# Patient Record
Sex: Female | Born: 1985 | Race: White | Hispanic: No | Marital: Married | Smoking: Never smoker
Health system: Southern US, Community
[De-identification: ages and names within clinical notes are randomized; demographics above are authoritative.]

## PROBLEM LIST (undated history)

## (undated) DIAGNOSIS — J9811 Atelectasis: Secondary | ICD-10-CM

## (undated) DIAGNOSIS — N809 Endometriosis, unspecified: Secondary | ICD-10-CM

## (undated) HISTORY — PX: LUNG SURGERY: SHX703

## (undated) HISTORY — PX: CHOLECYSTECTOMY: SHX55

---
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
## 2007-06-26 ENCOUNTER — Other Ambulatory Visit: Admission: RE | Admit: 2007-06-26 | Discharge: 2007-06-26 | Payer: Self-pay | Admitting: Family Medicine

## 2007-11-27 ENCOUNTER — Encounter: Admission: RE | Admit: 2007-11-27 | Discharge: 2007-11-27 | Payer: Self-pay | Admitting: Family Medicine

## 2007-12-07 ENCOUNTER — Ambulatory Visit (HOSPITAL_COMMUNITY): Admission: RE | Admit: 2007-12-07 | Discharge: 2007-12-07 | Payer: Self-pay | Admitting: Family Medicine

## 2008-01-17 ENCOUNTER — Encounter (INDEPENDENT_AMBULATORY_CARE_PROVIDER_SITE_OTHER): Payer: Self-pay | Admitting: General Surgery

## 2008-01-17 ENCOUNTER — Ambulatory Visit (HOSPITAL_COMMUNITY): Admission: RE | Admit: 2008-01-17 | Discharge: 2008-01-17 | Payer: Self-pay | Admitting: General Surgery

## 2008-03-12 ENCOUNTER — Encounter: Admission: RE | Admit: 2008-03-12 | Discharge: 2008-03-12 | Payer: Self-pay | Admitting: Gastroenterology

## 2009-05-19 IMAGING — NM NM HEPATO W/GB/PHARM/[PERSON_NAME]
3 series · 13 of 13 positions shown · non-contrast
Comparison: None

CLINICAL DATA: Epigastric abdominal pain.  Nausea vomiting.
Symptoms have been present for 2 months.

NUCLEAR MEDICINE HEPATOBILIARY IMAGING WITH GALLBLADDER EF
TECHNIQUE: Sequential images of the abdomen were obtained [DATE]
minutes following intravenous administration of
radiopharmaceutical.   gallbladder ejection fraction was
determined.
Radiopharmaceutical:  5mCi 7c-WWm Choletec

[Series 1: he hepatobiliary · 1 of 1 slices shown (1 of 3)]
[im 1/1]
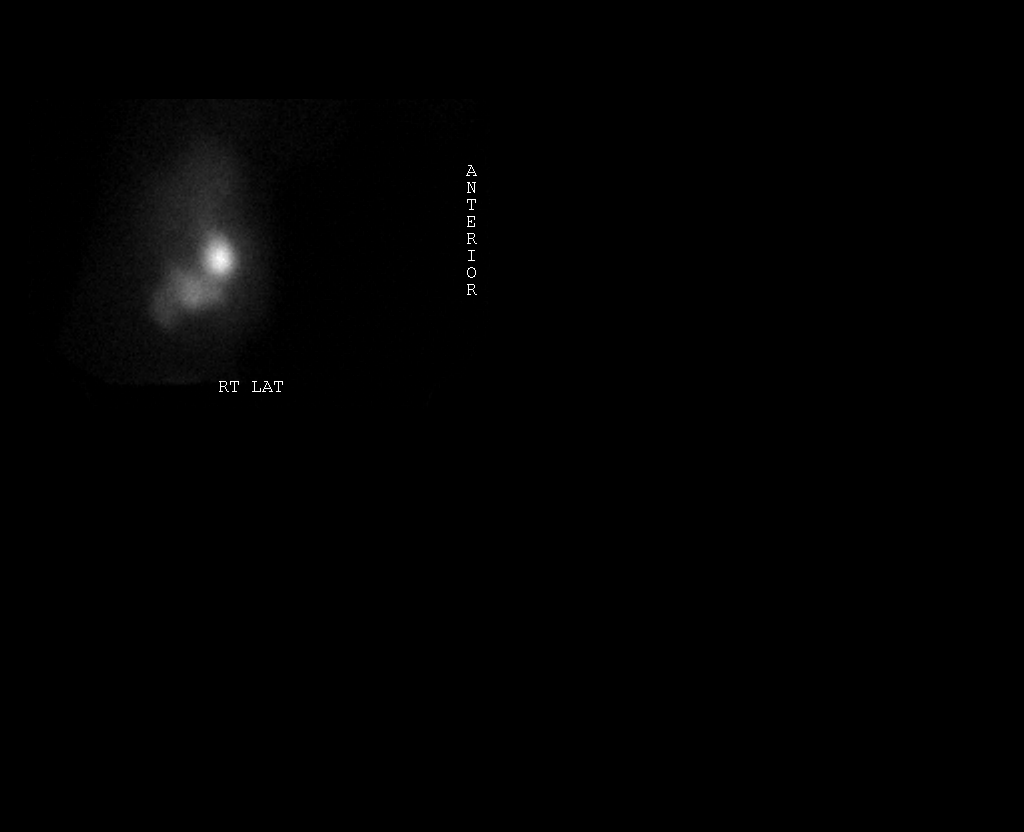

[Series 1: he hepatobiliary · 3.22mm/px · 6 of 60 frames shown (2 of 3)]
[frame 6/60]
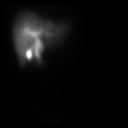
[frame 16/60]
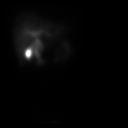
[frame 26/60]
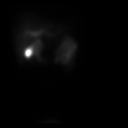
[frame 36/60]
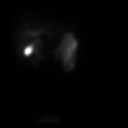
[frame 46/60]
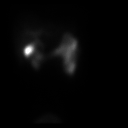
[frame 56/60]
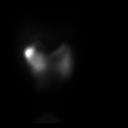

[Series 1: he hepatobiliary · 3.22mm/px · 6 of 60 frames shown (3 of 3)]
[frame 6/60]
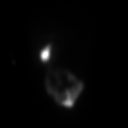
[frame 16/60]
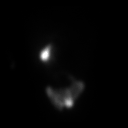
[frame 26/60]
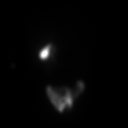
[frame 36/60]
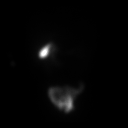
[frame 46/60]
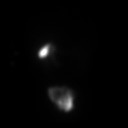
[frame 56/60]
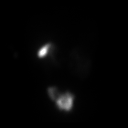

[13 of 13 positions shown; findings below may reference images not displayed]

FINDINGS: There was immediate uptake of the agent by the liver with
prompt excretion in the biliary tree with prompt visualization of
the gallbladder.  The gallbladder is visible 5 minutes  and there
is activity in the bowel at 10 minutes.

The patient was given 8 ounces of half and half  P O.  Ejection
fraction at 60 minutes was 18.2%.
IMPRESSION: Abnormal ejection fraction of only 18.2%  at 60 minutes.  However,
there was prompt visualization of the biliary tree and gallbladder.

## 2010-08-31 NOTE — Op Note (Signed)
NAMEIONE, SANDUSKY NO.:  [id number]   MEDICAL RECORD NO.:  [id number]          PATIENT TYPE:  AMB   LOCATION:  DAY                          FACILITY:  [facility]   PHYSICIAN:  Adolph Pollack, M.D.DATE OF BIRTH:  31-Oct-1985   DATE OF PROCEDURE:  01/17/2008  DATE OF DISCHARGE:                               OPERATIVE REPORT   PREOPERATIVE DIAGNOSIS:  Biliary dyskinesia.   POSTOPERATIVE DIAGNOSIS:  Biliary dyskinesia.   PROCEDURE:  Laparoscopic cholecystectomy with intraoperative  cholangiogram.   SURGEON:  Adolph Pollack, M.D.   ASSISTANT:  Wilmon Arms. Tsuei, M.D.   ANESTHESIA:  General.   INDICATIONS:  This 25 year old female has been having progressively  increasing problems with postprandial epigastric pain with nausea.  This  tends to be, at first it was worse with greasy or spicy meals but now  most things she eats if it is not very bland she has trouble.  She has  tried proton pump inhibitors, undergone endoscopy and there has been no  revealing pathology.  Abdominal ultrasound was normal but nuclear  medicine hepatic biliary scan demonstrates a depressed gallbladder  ejection fraction consistent with biliary dyskinesia.  She now presents  for elective laparoscopic cholecystectomy.   TECHNIQUE:  She was seen in the holding area and then brought to the  operating room and placed supine on the operating table and general  anesthetic was administered.  Her abdominal wall was sterilely prepped  and draped.  Marcaine was infiltrated in the subumbilical region.  A  subumbilical incision was made through the skin, subcutaneous tissue,  fascia and peritoneum entering the peritoneal cavity under direct  vision.  A pursestring suture of zero Vicryl was placed around the  fascial edges.  A Hasson trocar was introduced into the peritoneal  cavity and a pneumoperitoneum created by insufflation of CO2 gas.   Next a laparoscope was introduced.  Upon inspection  the liver appeared  to be normal.  Stomach appeared to be normal.   She was placed in a reverse Trendelenburg position with the right side  tilted slightly up.  An 11 mm trocar was placed through an epigastric  incision and two 5 mm trocars were placed in right upper quadrant  incisions after infiltration of Marcaine.  The fundus of the gallbladder  was grasped and there were adhesions between the gallbladder and omentum  which were taken down using blunt dissection and select electrocautery  close to the gallbladder.  The fundus of the gallbladder was then  retracted toward the right shoulder and the infundibulum retracted  laterally.  Using blunt dissection I was able to mobilize the  infundibulum.  I then identified the cystic duct and created a window  around it.  I also identified the anterior branch of the cystic artery,  created a window around it and clipped it.  There was still some  bleeding around this area at the anterior branch of the cystic artery  although it was minimal.  I decided to proceed with the cholangiogram.   I made a small incision in the cystic duct close to the  gallbladder and  passed a cholangiocatheter through the anterior abdominal wall and put  it into the cystic duct.  A clip was placed above the cystic duct  gallbladder junction.  Under real time fluoroscopy contrast was then  injected into the cystic duct which was of moderate length.  The common  hepatic, right and left hepatic and common bile ducts all filled and  contrast drained promptly into the duodenum without obvious evidence of  obstruction.  Final report is pending the radiologist's interpretation.   Following this I removed the cholangiocatheter, the cystic duct was  clipped three times proximally and divided.  I then divided the anterior  branch of the cystic artery which had been clipped.  I identified some  bleeding from the posterior branch which I was able to put clips across  and  control well.   Following this I dissected the gallbladder free from the liver using  electrocautery and placed it in an Endopouch bag.  The gallbladder fossa  was irrigated and inspected and hemostasis was adequate.  There was no  bile leak.  The gallbladder was then removed through the subumbilical  port and the subumbilical fascial defect was closed by tightening up and  tying down the pursestring suture.  The irrigation fluid was removed as  much as possible.  The remaining trocars were removed and the CO2 gas  released.  The skin incisions were closed with 4-0 Monocryl subcuticular  stitches followed by Steri-Strips and sterile dressings.  She tolerated  the procedure without any apparent complications and was taken to  recovery in satisfactory condition.      Adolph Pollack, M.D.  Electronically Signed     TJR/MEDQ  D:  01/17/2008  T:  01/18/2008  Job:  [id number]   cc:   Carilyn Goodpasture, PA

## 2011-01-17 LAB — CBC
HCT: 40.4
Hemoglobin: 13.8
MCHC: 34.1
MCV: 90.8
Platelets: 264
RBC: 4.45
RDW: 13.2
WBC: 10.7 — ABNORMAL HIGH

## 2011-01-17 LAB — BASIC METABOLIC PANEL
BUN: 6
CO2: 29
Calcium: 9.3
Chloride: 109
Creatinine, Ser: 0.59
GFR calc Af Amer: >60
GFR calc non Af Amer: >60
Glucose, Bld: 87
Potassium: 4.1
Sodium: 143

## 2011-01-17 LAB — DIFFERENTIAL
Basophils Absolute: 0.1
Basophils Relative: 1
Eosinophils Absolute: 0.3
Eosinophils Relative: 2
Lymphocytes Relative: 27
Lymphs Abs: 2.9
Monocytes Absolute: 0.8
Monocytes Relative: 7
Neutro Abs: 6.7
Neutrophils Relative %: 63

## 2011-01-17 LAB — PREGNANCY, URINE: Preg Test, Ur: NEGATIVE

## 2016-04-08 ENCOUNTER — Other Ambulatory Visit (HOSPITAL_COMMUNITY): Payer: Self-pay | Admitting: Obstetrics and Gynecology

## 2016-04-08 DIAGNOSIS — N979 Female infertility, unspecified: Secondary | ICD-10-CM

## 2016-04-15 ENCOUNTER — Encounter (HOSPITAL_COMMUNITY): Payer: Self-pay | Admitting: Radiology

## 2016-04-15 ENCOUNTER — Ambulatory Visit (HOSPITAL_COMMUNITY)
Admission: RE | Admit: 2016-04-15 | Discharge: 2016-04-15 | Disposition: A | Discharge status: Home / Self Care | Payer: BLUE CROSS/BLUE SHIELD | Source: Ambulatory Visit | Attending: Obstetrics and Gynecology | Admitting: Obstetrics and Gynecology

## 2016-04-15 ENCOUNTER — Other Ambulatory Visit (HOSPITAL_COMMUNITY): Payer: Self-pay | Admitting: Obstetrics and Gynecology

## 2016-04-15 DIAGNOSIS — N979 Female infertility, unspecified: Secondary | ICD-10-CM

## 2016-04-15 MED ORDER — IOPAMIDOL (ISOVUE-300) INJECTION 61%
30.0000 mL | Freq: Once | INTRAVENOUS | Status: AC | PRN
  Administered 2016-04-15: 4 mL

## 2016-04-15 MED ORDER — IOPAMIDOL (ISOVUE-300) INJECTION 61%: 4.0000 mL | Freq: Once | INTRAVENOUS | Status: DC | PRN

## 2022-02-10 ENCOUNTER — Ambulatory Visit
Admission: RE | Admit: 2022-02-10 | Discharge: 2022-02-10 | Disposition: A | Discharge status: Home / Self Care | Payer: BLUE CROSS/BLUE SHIELD | Source: Ambulatory Visit | Attending: Family Medicine | Admitting: Family Medicine

## 2022-02-10 VITALS — BP 131/87 | HR 119 | Temp 98.8°F | Resp 18

## 2022-02-10 DIAGNOSIS — Z113 Encounter for screening for infections with a predominantly sexual mode of transmission: Secondary | ICD-10-CM | POA: Diagnosis present

## 2022-02-10 HISTORY — DX: Endometriosis, unspecified: N80.9

## 2022-02-10 HISTORY — DX: Atelectasis: J98.11

## 2022-02-10 LAB — POCT URINE PREGNANCY: Preg Test, Ur: NEGATIVE

## 2022-02-10 NOTE — ED Triage Notes (Addendum)
Pt here today for STD screening. Found out husband was unfaithful and wants to be checked. Also states were trying for a pregnancy and would to have pregnancy test. LMP 01/15/22 Is currently breastfeeding.

## 2022-02-10 NOTE — ED Provider Notes (Signed)
[facility]    CSN: [id number] Arrival date & time: 02/10/22  1308      History   Chief Complaint Chief Complaint  Patient presents with   Screening for std    And preg test    HPI Brandi Pacheco is a 36 y.o. female.   HPI 36 year old female presents for possible STD.  Reports husband was unfaithful and request STD check as well as urine pregnancy test.  Reports last menstrual period of 01/15/2022 and is currently breast-feeding.  Past Medical History:  Diagnosis Date   Collapse of left lung    2013   Endometriosis     There are no problems to display for this patient.   Past Surgical History:  Procedure Laterality Date   CHOLECYSTECTOMY     2019   LUNG SURGERY     2013    OB History   No obstetric history on file.      Home Medications    Prior to Admission medications   Not on File    Family History History reviewed. No pertinent family history.  Social History Social History   Tobacco Use   Smoking status: Never   Smokeless tobacco: Never  Vaping Use   Vaping Use: Never used  Substance Use Topics   Alcohol use: Not Currently     Allergies   Fluconazole   Review of Systems Review of Systems  All other systems reviewed and are negative.    Physical Exam Triage Vital Signs ED Triage Vitals [02/10/22 1322]  Enc Vitals Group     BP 131/87     Pulse Rate (!) 119     Resp 18     Temp 98.8 F (37.1 C)     Temp Source Oral     SpO2 93 %     Weight      Height      Head Circumference      Peak Flow      Pain Score 0     Pain Loc      Pain Edu?      Excl. in GC?    No data found.  Updated Vital Signs BP 131/87 (BP Location: Right Arm)   Pulse (!) 119   Temp 98.8 F (37.1 C) (Oral)   Resp 18   LMP 01/15/2022 (Exact Date) Comment: is breastfeeding  SpO2 93%    Physical Exam Vitals and nursing note reviewed.  Constitutional:      General: She is not in acute distress.    Appearance: Normal appearance.  She is obese. She is not ill-appearing.  HENT:     Head: Normocephalic and atraumatic.     Mouth/Throat:     Mouth: Mucous membranes are moist.     Pharynx: Oropharynx is clear.  Eyes:     Extraocular Movements: Extraocular movements intact.     Conjunctiva/sclera: Conjunctivae normal.     Pupils: Pupils are equal, round, and reactive to light.  Cardiovascular:     Rate and Rhythm: Normal rate and regular rhythm.     Pulses: Normal pulses.     Heart sounds: Normal heart sounds.  Pulmonary:     Effort: Pulmonary effort is normal.     Breath sounds: Normal breath sounds. No wheezing, rhonchi or rales.  Musculoskeletal:        General: Normal range of motion.     Cervical back: Normal range of motion and neck supple.  Skin:    General:  Skin is warm and dry.  Neurological:     General: No focal deficit present.     Mental Status: She is alert and oriented to person, place, and time. Mental status is at baseline.      UC Treatments / Results  Labs (all labs ordered are listed, but only abnormal results are displayed) Labs Reviewed  RPR  HIV ANTIBODY (ROUTINE TESTING W REFLEX)  HEPATITIS C ANTIBODY  POCT URINE PREGNANCY  CERVICOVAGINAL ANCILLARY ONLY    EKG   Radiology No results found.  Procedures Procedures (including critical care time)  Medications Ordered in UC Medications - No data to display  Initial Impression / Assessment and Plan / UC Course  I have reviewed the triage vital signs and the nursing notes.  Pertinent labs & imaging results that were available during my care of the patient were reviewed by me and considered in my medical decision making (see chart for details).     MDM: 1.  Screening examination for STD-Advised patient we will follow-up with lab results once received.  Patient discharged home, hemodynamically stable. Final Clinical Impressions(s) / UC Diagnoses   Final diagnoses:  Screening examination for STD (sexually transmitted  disease)     Discharge Instructions      Advised patient we will follow-up with lab results once received.     ED Prescriptions   None    PDMP not reviewed this encounter.   Eliezer Lofts, [place] 02/10/22 1441

## 2022-02-10 NOTE — Discharge Instructions (Addendum)
Advised patient we will follow-up with lab results once received. 

## 2022-02-11 LAB — CERVICOVAGINAL ANCILLARY ONLY
Bacterial Vaginitis (gardnerella): NEGATIVE
Candida Glabrata: POSITIVE — AB
Candida Vaginitis: NEGATIVE
Chlamydia: NEGATIVE
Comment: NEGATIVE
Comment: NEGATIVE
Comment: NEGATIVE
Comment: NEGATIVE
Comment: NEGATIVE
Comment: NORMAL
Neisseria Gonorrhea: NEGATIVE
Trichomonas: NEGATIVE

## 2022-02-11 LAB — RPR: RPR Ser Ql: NONREACTIVE

## 2022-02-11 LAB — HIV ANTIBODY (ROUTINE TESTING W REFLEX): HIV 1&2 Ab, 4th Generation: NONREACTIVE

## 2022-02-11 LAB — HEPATITIS C ANTIBODY: Hepatitis C Ab: NONREACTIVE

## 2022-02-14 ENCOUNTER — Telehealth (HOSPITAL_COMMUNITY): Payer: Self-pay | Admitting: Emergency Medicine
